# Patient Record
Sex: Female | Born: 1990 | Race: Black or African American | Hispanic: No | Marital: Single | Smoking: Never smoker
Health system: Southern US, Community
[De-identification: ages and names within clinical notes are randomized; demographics above are authoritative.]

## PROBLEM LIST (undated history)

## (undated) DIAGNOSIS — Z30430 Encounter for insertion of intrauterine contraceptive device: Secondary | ICD-10-CM

## (undated) DIAGNOSIS — R87619 Unspecified abnormal cytological findings in specimens from cervix uteri: Secondary | ICD-10-CM

## (undated) HISTORY — DX: Encounter for insertion of intrauterine contraceptive device: Z30.430

## (undated) HISTORY — DX: Unspecified abnormal cytological findings in specimens from cervix uteri: R87.619

---

## 2011-01-07 DIAGNOSIS — Z30430 Encounter for insertion of intrauterine contraceptive device: Secondary | ICD-10-CM

## 2011-01-07 HISTORY — DX: Encounter for insertion of intrauterine contraceptive device: Z30.430

## 2012-04-08 DIAGNOSIS — R87619 Unspecified abnormal cytological findings in specimens from cervix uteri: Secondary | ICD-10-CM

## 2012-04-08 HISTORY — DX: Unspecified abnormal cytological findings in specimens from cervix uteri: R87.619

## 2012-05-25 ENCOUNTER — Telehealth: Payer: Self-pay | Admitting: *Deleted

## 2012-05-25 NOTE — Telephone Encounter (Signed)
Prior to [facility], I called patient to notify of Pap results.  Patient returned call today and recommendation for colposcopy reviewed.  Procedure explained and instructions given.  Mirena for contraception.  Appointment for 06-07-12, patient declined earlier appointment.

## 2012-06-02 ENCOUNTER — Encounter: Payer: Self-pay | Admitting: Gynecology

## 2012-06-06 HISTORY — PX: COLPOSCOPY: SHX161

## 2012-06-07 ENCOUNTER — Ambulatory Visit (INDEPENDENT_AMBULATORY_CARE_PROVIDER_SITE_OTHER): Payer: BC Managed Care – PPO | Admitting: Gynecology

## 2012-06-07 ENCOUNTER — Encounter: Payer: Self-pay | Admitting: Gynecology

## 2012-06-07 VITALS — BP 126/78

## 2012-06-07 DIAGNOSIS — R6889 Other general symptoms and signs: Secondary | ICD-10-CM

## 2012-06-07 DIAGNOSIS — IMO0002 Reserved for concepts with insufficient information to code with codable children: Secondary | ICD-10-CM | POA: Insufficient documentation

## 2012-06-07 NOTE — Procedures (Signed)
22 y.o. SingleAfrican American female   here for colposcopy. 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  Contraception:  IUD, Mirena placed 01/2011   Blood pressure 126/78, last menstrual period 05/08/2012.  Procedure explained and patient's questions were invited and answered.  Consent form signed.    Role of HPV in genesis of SIL discussed with patient, and questions answered.   Speculum inserted atraumatically and cervix visualized.  3% acetic acid applied.  Cervix examined using 3.75,7.5.15  X magnification and green filter.    Gross appearance:normal  Squamocolumnar junction seen in entirety: yes  Extent of lesion entirely seen: yes   no mosaicism, no punctation and acetowhite lesion(s) noted at 11 o'clock endocervical lesion noted at 11 o'clock, endocervical curettage performed and hemostasis achieved with Monsel's solution  Physical Exam  Genitourinary:      Patient tolerated procedure well.   Plan:  Will base further treatment on Pathology findings.  Post biopsy instructions and AVS given to patient.

## 2012-06-07 NOTE — Patient Instructions (Signed)
colpo instructions given to pt

## 2012-06-07 NOTE — Progress Notes (Deleted)
Subjective:     Patient ID: Gloria Love, female   DOB: 02/18/1991, 22 y.o.   MRN: [id number]  HPI   Review of Systems     Objective:   Physical Exam  Genitourinary:         Assessment:     ***    Plan:     ***

## 2013-05-28 ENCOUNTER — Other Ambulatory Visit (HOSPITAL_COMMUNITY): Payer: Self-pay | Admitting: Family Medicine

## 2013-05-28 DIAGNOSIS — R2231 Localized swelling, mass and lump, right upper limb: Secondary | ICD-10-CM

## 2013-05-30 ENCOUNTER — Ambulatory Visit (HOSPITAL_COMMUNITY): Payer: BC Managed Care – PPO

## 2013-05-31 ENCOUNTER — Ambulatory Visit (HOSPITAL_COMMUNITY)
Admission: RE | Admit: 2013-05-31 | Discharge: 2013-05-31 | Disposition: A | Discharge status: Home / Self Care | Payer: BC Managed Care – PPO | Source: Ambulatory Visit | Attending: Family Medicine | Admitting: Family Medicine

## 2013-05-31 DIAGNOSIS — R2231 Localized swelling, mass and lump, right upper limb: Secondary | ICD-10-CM

## 2013-05-31 DIAGNOSIS — N63 Unspecified lump in unspecified breast: Secondary | ICD-10-CM | POA: Insufficient documentation

## 2013-06-26 ENCOUNTER — Ambulatory Visit (INDEPENDENT_AMBULATORY_CARE_PROVIDER_SITE_OTHER): Payer: BC Managed Care – PPO | Admitting: Gynecology

## 2013-06-26 ENCOUNTER — Encounter: Payer: Self-pay | Admitting: Gynecology

## 2013-06-26 VITALS — BP 124/86 | HR 64 | Resp 18 | Ht 64.5 in | Wt 196.0 lb

## 2013-06-26 DIAGNOSIS — Z01419 Encounter for gynecological examination (general) (routine) without abnormal findings: Secondary | ICD-10-CM

## 2013-06-26 DIAGNOSIS — Z0000 Encounter for general adult medical examination without abnormal findings: Secondary | ICD-10-CM

## 2013-06-26 DIAGNOSIS — Z975 Presence of (intrauterine) contraceptive device: Secondary | ICD-10-CM

## 2013-06-26 DIAGNOSIS — IMO0002 Reserved for concepts with insufficient information to code with codable children: Secondary | ICD-10-CM

## 2013-06-26 DIAGNOSIS — R6889 Other general symptoms and signs: Secondary | ICD-10-CM

## 2013-06-26 LAB — POCT URINALYSIS DIPSTICK
BILIRUBIN UA: NEGATIVE
Glucose, UA: NEGATIVE
Ketones, UA: NEGATIVE
Leukocytes, UA: NEGATIVE
NITRITE UA: NEGATIVE
PH UA: 5
Protein, UA: NEGATIVE
RBC UA: NEGATIVE
Urobilinogen, UA: NEGATIVE

## 2013-06-26 NOTE — Progress Notes (Signed)
23 y.o. @MARITALSTS  @African  American female   G0P0 here for annual exam. Pt is currently sexually active.  She reports not using condoms on a regular basis.  First sexual activity at 23 years old, 6 number of lifetime partners.   Pt had abnormal PAP with colpo last year for 1st repeat PAP afterwards.  Pt has Mirena placed 2012, cycles are regular lighter and longer, less cramping.  Current partner 2y,   Patient's last menstrual period was 06/04/2013.          Sexually active: yes  The current method of family planning is IUD.    Exercising: yes  Cardio Last pap: 04/2012 - LGSIL. Colpo: 06/2012- STRIPS OF BENIGN ENDOCERVICAL MUCOSA ASSOCIATED WITH ACUTE INFLAMMATION. -NEGATIVE FOR ATYPIA. Alcohol: yes Tobacco: No Drugs: No Gardisil: yes, completed: yes   Health Maintenance  Topic Date Due  . Pap Smear  11/11/2007  . Tetanus/tdap  11/10/2008  . Influenza Vaccine  10/06/2013    Family History  Problem Relation Age of Onset  . Hypertension Mother   . Pulmonary embolism Mother   . Pulmonary embolism Sister   . Diabetes Father     Patient Active Problem List   Diagnosis Date Noted  . LGSIL (low grade squamous intraepithelial dysplasia) 06/07/2012    Past Medical History  Diagnosis Date  . Encounter for insertion of mirena IUD 11/12  . Abnormal Pap smear of cervix 04/2012    Past Surgical History  Procedure Laterality Date  . Colposcopy  06/2012    Allergies: Review of patient's allergies indicates no known allergies.  Current Outpatient Prescriptions  Medication Sig Dispense Refill  . cetirizine (ZYRTEC) 10 MG tablet Take 10 mg by mouth as needed for allergies.      Marland Kitchen. levonorgestrel (MIRENA) 20 MCG/24HR IUD 1 each by Intrauterine route once.      . Multiple Vitamins-Minerals (MULTIVITAMIN PO) Take by mouth.      . pseudoephedrine (SUDAFED) 60 MG tablet Take 60 mg by mouth as needed for congestion.       No current facility-administered medications for this visit.     ROS: Pertinent items are noted in HPI.  Exam:    BP 124/86  Pulse 64  Resp 18  Ht 5' 4.5" (1.638 m)  Wt 196 lb (88.905 kg)  BMI 33.14 kg/m2  LMP 06/04/2013 Weight change: @WEIGHTCHANGE @ Last 3 height recordings:  Ht Readings from Last 3 Encounters:  06/26/13 5' 4.5" (1.638 m)   General appearance: alert, cooperative and appears stated age Head: Normocephalic, without obvious abnormality, atraumatic Neck: no adenopathy, no carotid bruit, no JVD, supple, symmetrical, trachea midline and thyroid not enlarged, symmetric, no tenderness/mass/nodules Lungs: clear to auscultation bilaterally Breasts: normal appearance, no masses or tenderness Heart: regular rate and rhythm, S1, S2 normal, no murmur, click, rub or gallop Abdomen: soft, non-tender; bowel sounds normal; no masses,  no organomegaly Extremities: extremities normal, atraumatic, no cyanosis or edema Skin: Skin color, texture, turgor normal. No rashes or lesions Lymph nodes: Cervical, supraclavicular, and axillary nodes normal. no inguinal nodes palpated Neurologic: Grossly normal   Pelvic: External genitalia:  no lesions              Urethra: normal appearing urethra with no masses, tenderness or lesions              Bartholins and Skenes: Bartholin's, Urethra, Skene's normal                 Vagina: normal appearing vagina with normal  color and discharge, no lesions              Cervix: normal appearance and IUD string visualized              Pap taken: yes        Bimanual Exam:  Uterus:  uterus is normal size, shape, consistency and nontender                                      Adnexa:    normal adnexa in size, nontender and no masses                                      Rectovaginal: Confirms                                      Anus:  normal sphincter tone, no lesions   1. Routine gynecological examination well woman  2. Laboratory examination ordered as part of a routine general medical examination  - POCT  Urinalysis Dipstick  3. LGSIL (low grade squamous intraepithelial dysplasia)  - PAP with Reflex to HPV (IPS) Repeat 1y if normal, PAP guidelines reviewed with pt  4. IUD (intrauterine device) in place mirena out 2017    An After Visit Summary was printed and given to the patient.

## 2013-06-28 LAB — IPS PAP TEST WITH REFLEX TO HPV

## 2013-06-29 ENCOUNTER — Other Ambulatory Visit: Payer: Self-pay | Admitting: Gynecology

## 2013-06-29 DIAGNOSIS — IMO0002 Reserved for concepts with insufficient information to code with codable children: Secondary | ICD-10-CM

## 2013-06-29 NOTE — Progress Notes (Signed)
This is pt's second LGSIL in 2y.  colpo was c/w pap, will need another colposcopy.  Order dropped

## 2013-07-03 ENCOUNTER — Telehealth: Payer: Self-pay | Admitting: Emergency Medicine

## 2013-07-03 NOTE — Telephone Encounter (Signed)
Message copied by Joeseph AmorFAST, Hymie Gorr L on Tue Jul 03, 2013  9:08 AM ------      Message from: Douglass RiversLATHROP, Harika Laidlaw      Created: Fri Jun 29, 2013  9:14 AM       This is pt's second LGSIL in 2y.  colpo was c/w pap, will need another colposcopy.  Order dropped ------

## 2013-07-03 NOTE — Telephone Encounter (Signed)
Message left to return call to Chrisotpher Rivero at [phone].    

## 2013-07-03 NOTE — Telephone Encounter (Signed)
Pt returning call

## 2013-07-03 NOTE — Telephone Encounter (Signed)
Patient returned call and I spoke with patient at time of incoming call. There was a lot of background noise and people talking in the background. I advised I had to speak with her regarding a message from Dr. Farrel GobbleLathrop, patient agreed. Message from Dr. Farrel GobbleLathrop given. Patient's response was "I will have to call you back".  Advised patient when she returns call can speak in more detail about procedure and pre procedure instructions, patient agreeable and call was ended.

## 2013-07-03 NOTE — Telephone Encounter (Signed)
Patient has IUD-Mirena Precert done: $5.   Message left to return call to Chase Cityracy at [phone].

## 2013-07-09 NOTE — Telephone Encounter (Signed)
Patient returned call. She is agreeable to scheduling colposcopy.  Patient has questions about last smear results and prior results of colposcopy.   Advised patient that pap smear in 2014 was abnomal but colposcopy showed inflammation. This year pap smear results are same as 2014 and still requires colposcopy to evaluate any further cell changes since pap smear continues to be abnormal. Patient wanted to know if she has been tested for HPV. I advised that I do not see any hpv testing but that her pap smear changes are most known to be caused by HPV.  Advised patient that she can further discuss results with Dr. Farrel GobbleLathrop at time of colposcopy appointment.   Colposcopy pre-procedure instructions given. Motrin instructions given. Motrin=Advil=Ibuprofen Can take 800 mg (Can purchase over the counter, you will need four 200 mg pills).  Take with food. Make sure to eat a meal before appointment and drink plenty of fluids. Patient verbalized understanding and will call to reschedule if will be on menses or has any concerns regarding pregnancy. Advised will need to cancel within 24 hours or will have $100.00 no show fee placed to account.   Routing to provider for final review. Patient agreeable to disposition. Will close encounter

## 2013-07-13 ENCOUNTER — Telehealth: Payer: Self-pay | Admitting: Gynecology

## 2013-07-13 NOTE — Telephone Encounter (Signed)
Message left to return call to Justiss Gerbino at [phone].    

## 2013-07-13 NOTE — Telephone Encounter (Signed)
Spoke with patient. She would like to change her colposcopy appointment to an early morning appointment.  She has mirena, does not expect cycle. Changed appointment to 0700 07/27/13. Colposcopy pre-procedure instructions given. Motrin instructions given. Motrin=Advil=Ibuprofen Can take 800 mg (Can purchase over the counter, you will need four 200 mg pills) every 8 hours as needed.  Take with food. Make sure to eat a meal before appointment and drink plenty of fluids. Patient verbalized understanding and will call to reschedule if will be on menses or has any concerns regarding pregnancy. Advised will need to cancel within 24 hours or will have $100.00 no show fee placed to account.     Routing to provider for final review. Patient agreeable to disposition. Will close encounter

## 2013-07-13 NOTE — Telephone Encounter (Signed)
Patient wants to reschedule her colpo

## 2013-07-25 ENCOUNTER — Telehealth: Payer: Self-pay | Admitting: Gynecology

## 2013-07-25 ENCOUNTER — Ambulatory Visit: Payer: BC Managed Care – PPO | Admitting: Gynecology

## 2013-07-25 NOTE — Telephone Encounter (Signed)
Patient calling to request r/s of colposcopy that she has scheduled for 07/27/13 with Dr. Farrel GobbleLathrop. She states that she is having some spotting and does not want to have to cancel the day of. Also, she states she will be moving two hours away this weekend as well. Appointment r/s to 08/17/13 at 1430 with Dr. Farrel GobbleLathrop, date and time per her request. Advised that scheduling colposcopy is not something that should be delayed as it is a diagnostic test for abnormal cells on her cervix up to and including cancer cells. Patient verbalized understanding. She states that she normally does not have spotting since having mirena placed, but that she has been experiencing increased spotting, especially after intercourse. Advised that Bleeding profile with Mirena can include spotting and possibly no period, but that ongoing spotting with sexual activity does necessitate evaluation by Dr. Farrel GobbleLathrop. Patient verbalized understanding again. Patient states she will call on Thursday afternoon to update regarding her vaginal bleeding and if available will move her appointment back to Friday 07/27/13, if spot still available.   Dr. Farrel GobbleLathrop, do you need to see patient for evaluation prior to colposcopy at this time due to her complaints?

## 2013-07-26 NOTE — Telephone Encounter (Signed)
Spotting is ok, bleeding is not, try to keep appt

## 2013-07-27 ENCOUNTER — Ambulatory Visit: Payer: BC Managed Care – PPO | Admitting: Gynecology

## 2013-07-31 NOTE — Telephone Encounter (Signed)
Message left to return call to Ras Kollman at [phone].    

## 2013-08-01 NOTE — Telephone Encounter (Signed)
Pt returned call and states that she is no longer having spotting with intercourse. She will keep appointment as scheduled for 08/17/13 with Dr. Farrel Gobble.   Routing to provider for final review. Patient agreeable to disposition. Will close encounter

## 2013-08-17 ENCOUNTER — Ambulatory Visit (INDEPENDENT_AMBULATORY_CARE_PROVIDER_SITE_OTHER): Payer: BC Managed Care – PPO | Admitting: Gynecology

## 2013-08-17 VITALS — BP 104/74 | Resp 14 | Ht 64.5 in | Wt 191.0 lb

## 2013-08-17 DIAGNOSIS — IMO0002 Reserved for concepts with insufficient information to code with codable children: Secondary | ICD-10-CM

## 2013-08-17 DIAGNOSIS — R6889 Other general symptoms and signs: Secondary | ICD-10-CM

## 2013-08-17 NOTE — Progress Notes (Addendum)
Patient ID: Gloria Love, female   DOB: 10/14/1990, 23 y.o.   MRN: [id number]  Chief Complaint  Patient presents with  . Procedure    Patient is here for Colposcopy Last Pap     HPI Gloria Love is a 23 y.o. female.   HPI  Indications: Pap smear on May 2015 showed: low-grade squamous intraepithelial neoplasia (LGSIL - encompassing HPV,mild dysplasia,CIN I). Previous colposcopy: ECC negative and in 2014, for LGSIL. Prior cervical treatment: no treatment.Procedure explained and patient's questions were invited and answered.  Consent form signed.    Role of HPV in genesis of SIL discussed with patient, and questions answered.     Past Medical History  Diagnosis Date  . Encounter for insertion of mirena IUD 11/12  . Abnormal Pap smear of cervix 04/2012    Past Surgical History  Procedure Laterality Date  . Colposcopy  06/2012    Family History  Problem Relation Age of Onset  . Hypertension Mother   . Pulmonary embolism Mother   . Pulmonary embolism Sister   . Diabetes Father     Social History History  Substance Use Topics  . Smoking status: Never Smoker   . Smokeless tobacco: Never Used  . Alcohol Use: 3.0 oz/week    5 Glasses of wine per week    No Known Allergies  Current Outpatient Prescriptions  Medication Sig Dispense Refill  . cetirizine (ZYRTEC) 10 MG tablet Take 10 mg by mouth as needed for allergies.      Marland Kitchen. levonorgestrel (MIRENA) 20 MCG/24HR IUD 1 each by Intrauterine route once.      . Multiple Vitamins-Minerals (MULTIVITAMIN PO) Take by mouth.      . pseudoephedrine (SUDAFED) 60 MG tablet Take 60 mg by mouth as needed for congestion.       No current facility-administered medications for this visit.    Review of Systems Review of Systems  Blood pressure 104/74, resp. rate 14, height 5' 4.5" (1.638 m), weight 191 lb (86.637 kg).  Physical Exam Physical Exam  Nursing note and vitals reviewed. Constitutional: She is oriented to person, place, and  time. She appears well-developed and well-nourished.  Genitourinary:    Neurological: She is alert and oriented to person, place, and time.    Data Reviewed Prior PAP and colpo images  Assessment    Procedure Details  The risks and benefits of the procedure and Written informed consent obtained.  Marland Kitchen.Speculum inserted atraumatically and cervix visualized.  3% acetic acid applied.  Cervix examined using 3.75 and 7.5 and 15   X magnification and green filter.    Gross appearance:normal  Squamocolumnar junction seen in entirety: yes   no visible lesions, no mosaicism, no punctation, no abnormal vasculature and acetowhite lesion(s) noted at 6 o'clock  cervix swabbed with Lugol's solution, SCJ visualized - lesion at 6 o'clock, cervical biopsies taken at 6 o'clock, specimen labelled and sent to pathology and hemostasis achieved with Monsel's solution  Extent of lesion entirely seen: yes    Specimens: 12, 6 o'clock  Complications: none.     Plan    Specimens labelled and sent to Pathology. Triage based on results      Karalina Tift H 08/17/2013, 2:44 PM    Patient tolerated procedure well.    Post biopsy instructions and AVS given to patient.     A. CERVIX, "6:00", BIOPSY: -LOW-GRADE SQUAMOUS INTRAEPITHELIAL LESION (MILD DYSPLASIA) AND HPV RELATED CYTOPATHIC CHANGE. -NEGATIVE FOR MALIGNANCY. B. CERVIX, "12:00", BIOPSY: -LOW GRADE SQUAMOUS INTRAEPITHELIAL LESION (MILD  DYSPLASIA) AND HPV RELATED CYTOPATHIC CHANGE. -NEGATIVE FOR MALIGNANCY  Recall 8

## 2013-08-17 NOTE — Patient Instructions (Signed)

## 2013-08-20 NOTE — Addendum Note (Signed)
Addended by: Douglass RiversLATHROP, Yeni Jiggetts on: 08/20/2013 10:53 AM   Modules accepted: Orders

## 2013-08-22 ENCOUNTER — Telehealth: Payer: Self-pay | Admitting: Emergency Medicine

## 2013-08-22 LAB — IPS OTHER TISSUE BIOPSY

## 2013-08-22 NOTE — Telephone Encounter (Signed)
Message copied by Joeseph AmorFAST, TRACY L on Wed Aug 22, 2013 12:10 PM ------      Message from: Douglass RiversLATHROP, TRACY      Created: Wed Aug 22, 2013 11:02 AM       Inform biopsies are c/w pap-cin I, no treatment needed but repeat pap next year, recall 8 ------

## 2013-08-22 NOTE — Telephone Encounter (Signed)
Spoke with patient and message from Dr. Farrel GobbleLathrop given. Patient verbalized understanding and patient agreeable to scheduling follow up with Dr. Farrel GobbleLathrop for annual exam with pap smear. Appointment scheduled. Stressed importance of follow up.

## 2013-08-23 NOTE — Telephone Encounter (Signed)
Patient returned call. Advised no new message, just accidental re-call. She has questions about how she can best care for herself going forward. Suggested Healthy Lifestyle, eating well balance diet, exercise, quitting smoking, staying away from second hand smoke and follow up pap smears. She is verbalizes understanding.  Routing to Aeson Sawyers for final review. Patient agreeable to disposition. Will close encounter

## 2013-08-23 NOTE — Telephone Encounter (Signed)
There was no note in result note saying pt was notified of result but only a phone call. lmtcb to give pt results but tracy already talked to her

## 2013-08-23 NOTE — Telephone Encounter (Signed)
Message copied by Eliezer BottomJOHNSON, DAVINA J on Thu Aug 23, 2013  2:42 PM ------      Message from: Douglass RiversLATHROP, TRACY      Created: Wed Aug 22, 2013 11:02 AM       Inform biopsies are c/w pap-cin I, no treatment needed but repeat pap next year, recall 8 ------

## 2014-02-05 ENCOUNTER — Telehealth: Payer: Self-pay

## 2014-02-05 NOTE — Telephone Encounter (Signed)
lmtcb to reschedule AeX with Dr. Farrel GobbleLathrop

## 2014-06-28 ENCOUNTER — Ambulatory Visit: Payer: BC Managed Care – PPO | Admitting: Gynecology

## 2014-09-27 ENCOUNTER — Telehealth: Payer: Self-pay | Admitting: *Deleted

## 2014-09-27 NOTE — Telephone Encounter (Signed)
08 Pap recall due 06/2014 due to CIN-I on colposcopy 08/20/13.  Per Release of Information dated 06/25/14, pt has relocated to [place].   Please advise recall.

## 2014-09-27 NOTE — Telephone Encounter (Signed)
Ok to remove from recall

## 2014-10-02 NOTE — Telephone Encounter (Signed)
Pt removed from current recall.   Closing encounter. 

## 2015-12-26 IMAGING — US US EXTREM UP *R* LTD
1 series · 14 of 15 positions shown · non-contrast
Comparison: None

CLINICAL DATA: Right axillary mass

EXAM:
ULTRASOUND RIGHT UPPER EXTREMITY LIMITED
TECHNIQUE: Ultrasound examination of the upper extremity soft tissues was
performed in the area of clinical concern at the right axilla.

[Series 1: us extrem up *right* ltd · 0.08mm/px · 14 of 15 slices shown]
[im 1/15]
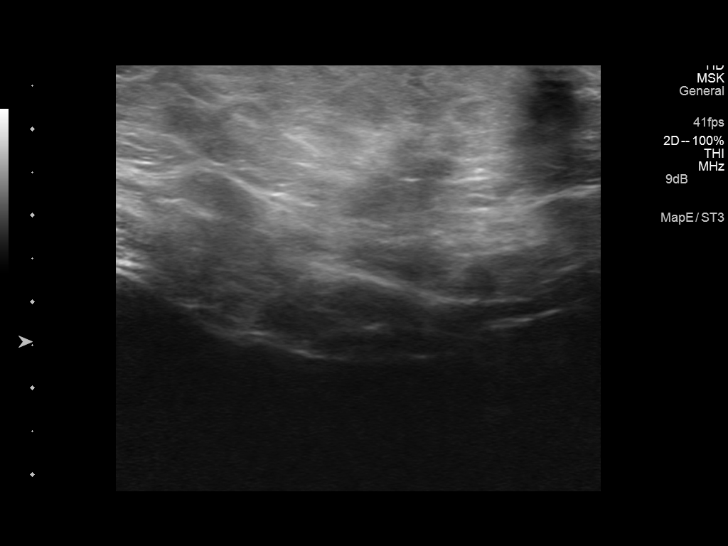
[im 2/15]
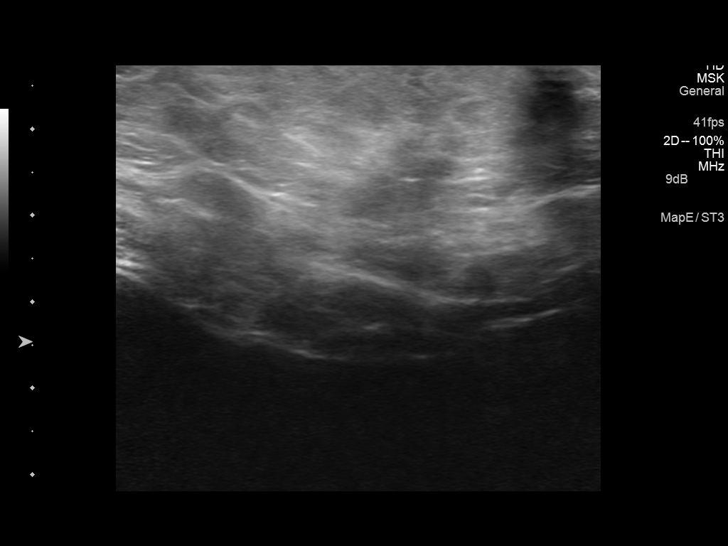
[im 3/15]
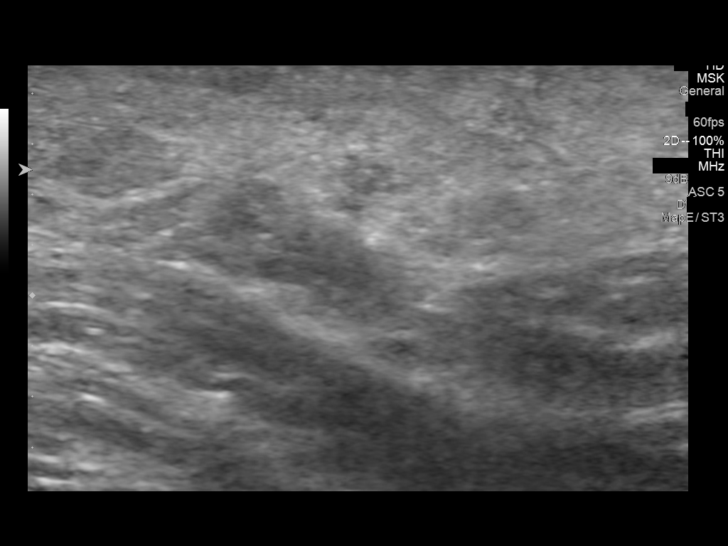
[im 4/15]
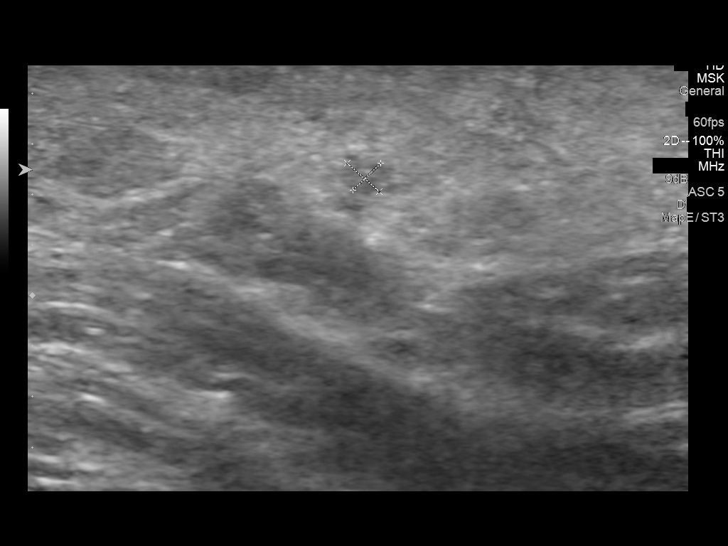
[im 5/15]
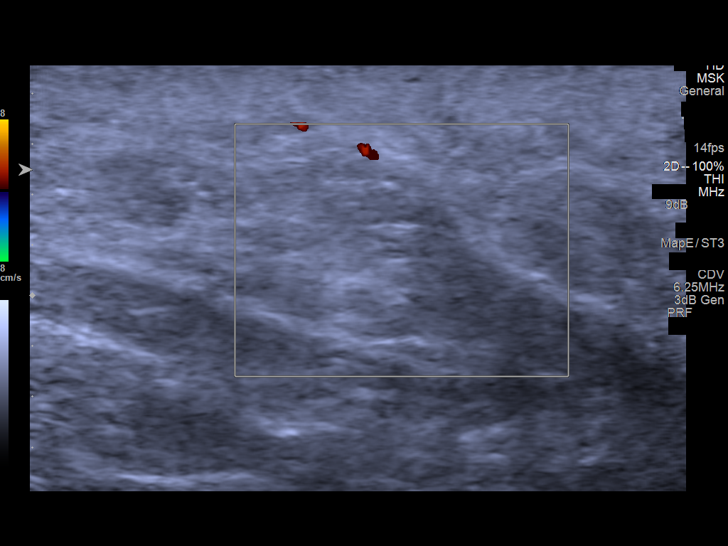
[im 6/15]
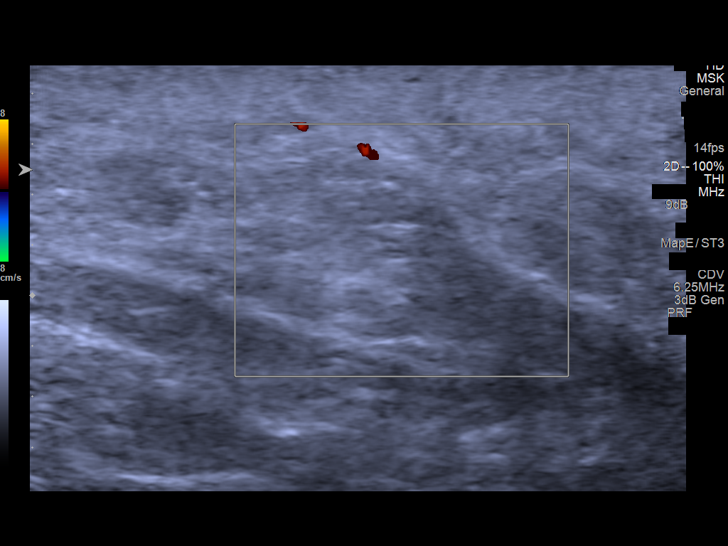
[im 7/15]
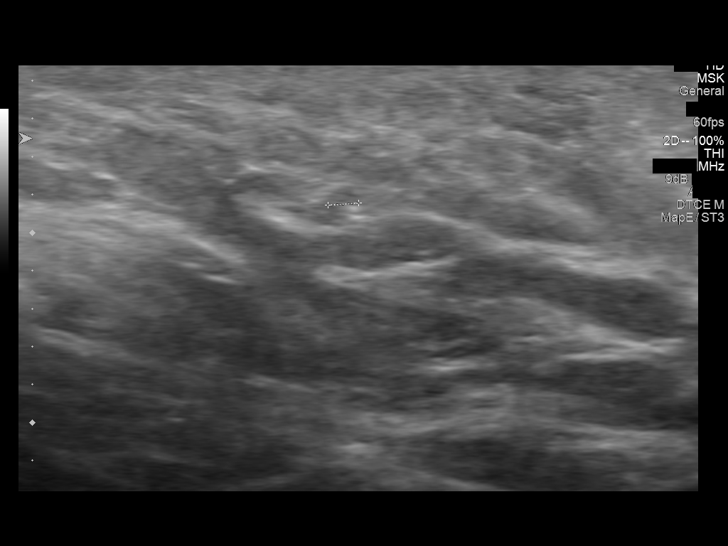
[im 9/15]
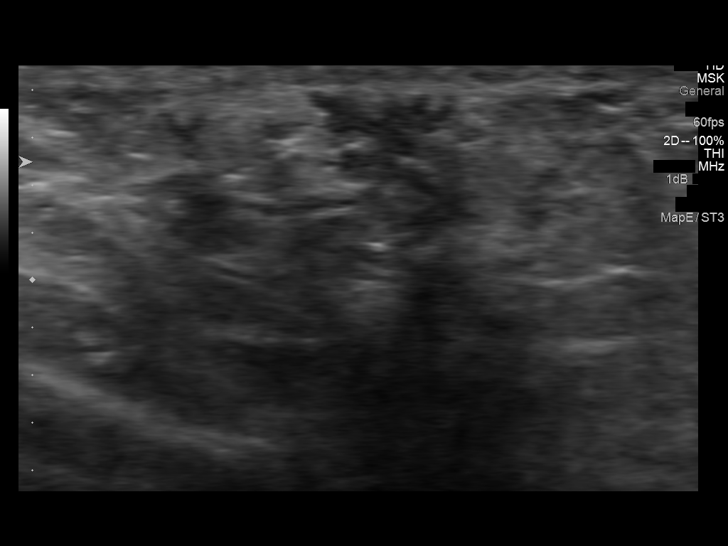
[im 10/15]
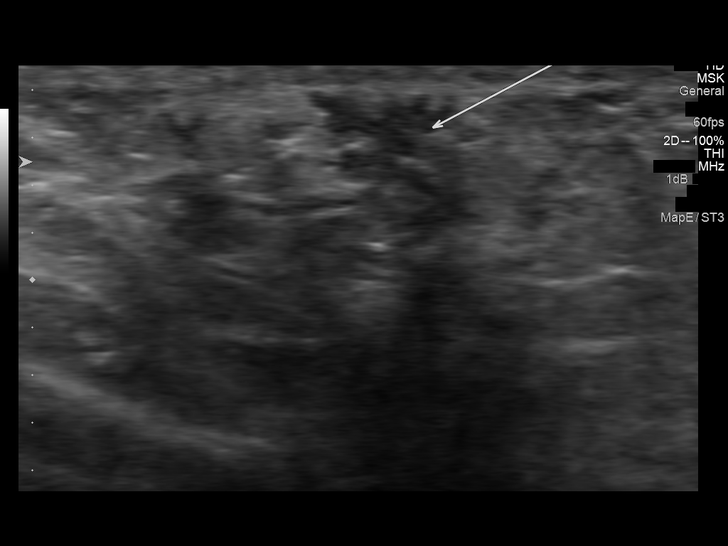
[im 11/15]
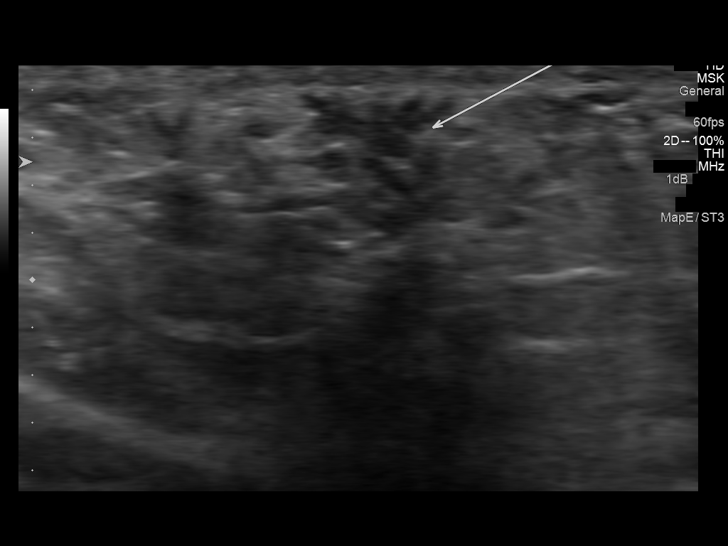
[im 12/15]
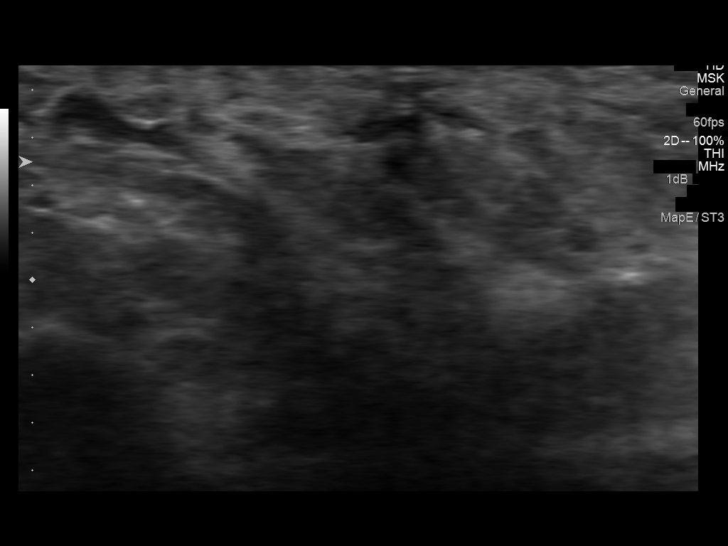
[im 13/15]
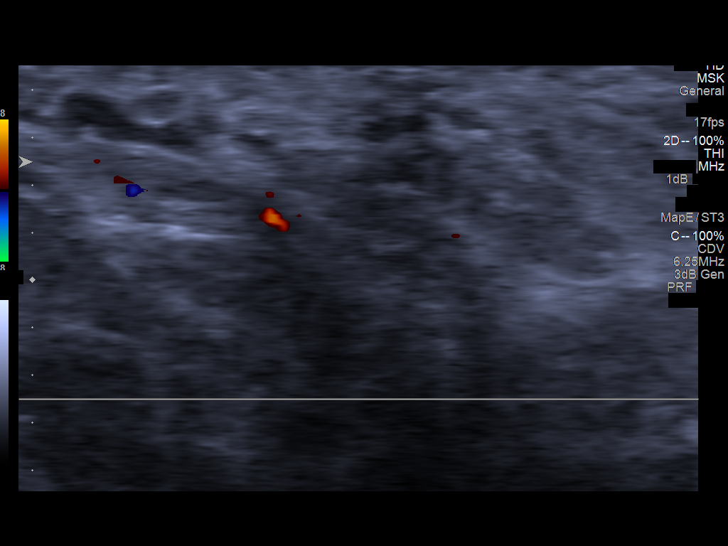
[im 14/15]
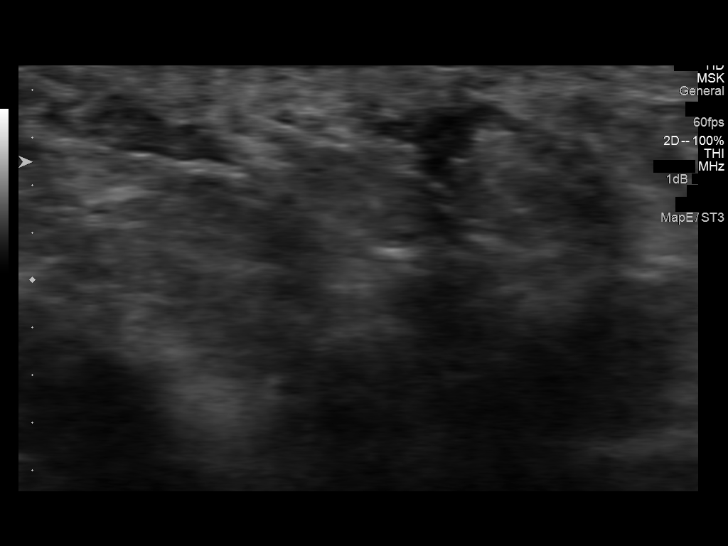
[im 15/15]
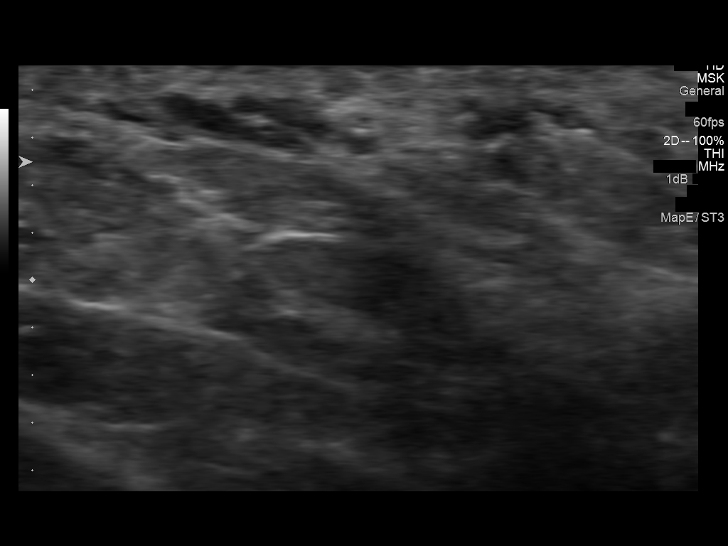

[14 of 15 positions shown; findings below may reference images not displayed]

FINDINGS: At the site of clinical concern within the right axilla, no definite
mass lesion is identified.

An area of poorly defined hypo echogenicity is identified suggesting
edema.

This could be the result of trauma, infection or benign subcutaneous
edema.

No discrete mass, calcification or encapsulated fluid collection
identified.
IMPRESSION: Soft tissue edema in the right axilla, nonspecific, could be seen
with infection, contusion/hematoma, or subcutaneous edema.
